# Patient Record
Sex: Female | Born: 1995 | Race: White | Hispanic: No | Marital: Single | Smoking: Never smoker
Health system: Southern US, Community
[De-identification: ages and names within clinical notes are randomized; demographics above are authoritative.]

## PROBLEM LIST (undated history)

## (undated) DIAGNOSIS — F988 Other specified behavioral and emotional disorders with onset usually occurring in childhood and adolescence: Secondary | ICD-10-CM

## (undated) HISTORY — DX: Other specified behavioral and emotional disorders with onset usually occurring in childhood and adolescence: F98.8

## (undated) HISTORY — PX: TONSILLECTOMY: SUR1361

---
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
## 2018-05-11 ENCOUNTER — Encounter (HOSPITAL_BASED_OUTPATIENT_CLINIC_OR_DEPARTMENT_OTHER): Payer: Self-pay | Admitting: Emergency Medicine

## 2018-05-11 ENCOUNTER — Other Ambulatory Visit: Payer: Self-pay

## 2018-05-11 ENCOUNTER — Emergency Department (HOSPITAL_BASED_OUTPATIENT_CLINIC_OR_DEPARTMENT_OTHER)
Admission: EM | Admit: 2018-05-11 | Discharge: 2018-05-12 | Disposition: A | Discharge status: Home / Self Care | Payer: Medicaid Other | Attending: Emergency Medicine | Admitting: Emergency Medicine

## 2018-05-11 DIAGNOSIS — R109 Unspecified abdominal pain: Secondary | ICD-10-CM | POA: Diagnosis not present

## 2018-05-11 LAB — URINALYSIS, MICROSCOPIC (REFLEX)

## 2018-05-11 LAB — PREGNANCY, URINE: Preg Test, Ur: NEGATIVE

## 2018-05-11 LAB — URINALYSIS, ROUTINE W REFLEX MICROSCOPIC
BILIRUBIN URINE: NEGATIVE
Glucose, UA: NEGATIVE mg/dL
Ketones, ur: NEGATIVE mg/dL
Leukocytes, UA: NEGATIVE
NITRITE: NEGATIVE
PH: 6 (ref 5.0–8.0)
PROTEIN: NEGATIVE mg/dL
SPECIFIC GRAVITY, URINE: 1.02 (ref 1.005–1.030)

## 2018-05-11 NOTE — ED Provider Notes (Signed)
[facility] EMERGENCY DEPARTMENT Provider Note   CSN: [id number] Arrival date & time: 05/11/18  1843     History   Chief Complaint Chief Complaint  Patient presents with  . Abdominal Pain    HPI Patricia Middleton is a 22 y.o. female.  Patient is a 22 year old female with past medical history of prior C-section and prior appendectomy.  She presents today with complaints of left lower quadrant and left flank pain.  This started on Christmas and has worsened.  She does report some burning with urination but denies any hematuria.  She denies any fevers or chills.  The history is provided by the patient.  Abdominal Pain   This is a new problem. Episode onset: 5 days ago. The problem occurs constantly. The problem has been gradually worsening. The pain is associated with an unknown factor. The pain is located in the LLQ (Left flank). The quality of the pain is cramping. The pain is moderate. Associated symptoms include dysuria. Pertinent negatives include fever, hematochezia, melena, constipation, frequency and hematuria.    Past Medical History:  Diagnosis Date  . ADD (attention deficit disorder)     Patient Active Problem List   Diagnosis Date Noted  . Sports physical 11/03/2012    Past Surgical History:  Procedure Laterality Date  . TONSILLECTOMY       OB History   No obstetric history on file.      Home Medications    Prior to Admission medications   Medication Sig Start Date End Date Taking? Authorizing Provider  amphetamine-dextroamphetamine (ADDERALL XR) 10 MG 24 hr capsule Take 10 mg by mouth every morning.    [provider]    Family History Family History  Problem Relation Age of Onset  . Sudden death Neg Hx   . Heart attack Neg Hx     Social History Social History   Tobacco Use  . Smoking status: Never Smoker  . Smokeless tobacco: Current User    Types: Snuff  Substance Use Topics  . Alcohol use: Never    Frequency: Never  .  Drug use: Never     Allergies   Patient has no known allergies.   Review of Systems Review of Systems  Constitutional: Negative for fever.  Gastrointestinal: Positive for abdominal pain. Negative for constipation, hematochezia and melena.  Genitourinary: Positive for dysuria. Negative for frequency and hematuria.  All other systems reviewed and are negative.    Physical Exam Updated Vital Signs BP 116/63 (BP Location: Left Arm)   Pulse 70   Temp 99 F (37.2 C) (Oral)   Resp 18   Ht 5' 5.5" (1.664 m)   Wt 99.8 kg   LMP 05/11/2018   SpO2 100%   BMI 36.05 kg/m   Physical Exam Vitals signs and nursing note reviewed.  Constitutional:      General: She is not in acute distress.    Appearance: She is well-developed. She is not diaphoretic.  HENT:     Head: Normocephalic and atraumatic.  Neck:     Musculoskeletal: Normal range of motion and neck supple.  Cardiovascular:     Rate and Rhythm: Normal rate and regular rhythm.     Heart sounds: No murmur. No friction rub. No gallop.   Pulmonary:     Effort: Pulmonary effort is normal. No respiratory distress.     Breath sounds: Normal breath sounds. No wheezing.  Abdominal:     General: Bowel sounds are normal. There is no distension.  Palpations: Abdomen is soft.     Tenderness: There is abdominal tenderness in the left lower quadrant. There is no guarding or rebound.     Comments: There is mild tenderness to palpation in the left lower quadrant.  Musculoskeletal: Normal range of motion.  Skin:    General: Skin is warm and dry.  Neurological:     Mental Status: She is alert and oriented to person, place, and time.      ED Treatments / Results  Labs (all labs ordered are listed, but only abnormal results are displayed) Labs Reviewed  URINALYSIS, ROUTINE W REFLEX MICROSCOPIC - Abnormal; Notable for the following components:      Result Value   Hgb urine dipstick TRACE (*)    All other components within normal  limits  URINALYSIS, MICROSCOPIC (REFLEX) - Abnormal; Notable for the following components:   Bacteria, UA RARE (*)    All other components within normal limits  PREGNANCY, URINE  POC URINE PREG, ED    EKG None  Radiology No results found.  Procedures Procedures (including critical care time)  Medications Ordered in ED Medications - No data to display   Initial Impression / Assessment and Plan / ED Course  I have reviewed the triage vital signs and the nursing notes.  Pertinent labs & imaging results that were available during my care of the patient were reviewed by me and considered in my medical decision making (see chart for details).  Patient presents with left-sided flank and abdominal pain, the etiology of which I am uncertain.  Her CT scan is normal, urinalysis is clear, and pregnancy test is negative.  Patient appears well and I see no indication for further work-up.  She will be discharged, to return as needed for any problems.  Final Clinical Impressions(s) / ED Diagnoses   Final diagnoses:  None    ED Discharge Orders    None       Geoffery Lyonselo, Haward Pope, MD 05/12/18 0221

## 2018-05-11 NOTE — ED Triage Notes (Signed)
Pt c/o 8/10 left lower abd pain radiating to her back and some burning sensation with urination.

## 2018-05-12 ENCOUNTER — Emergency Department (HOSPITAL_BASED_OUTPATIENT_CLINIC_OR_DEPARTMENT_OTHER): Payer: Medicaid Other

## 2018-05-12 NOTE — Discharge Instructions (Addendum)
Ibuprofen 600 mg every 6 hours as needed for pain.  Follow-up with your primary doctor if symptoms or not improving in the next 2 to 3 days, and return to the ER if symptoms significantly worsen or change.

## 2019-01-23 ENCOUNTER — Other Ambulatory Visit: Payer: Self-pay

## 2019-01-23 ENCOUNTER — Encounter (HOSPITAL_BASED_OUTPATIENT_CLINIC_OR_DEPARTMENT_OTHER): Payer: Self-pay | Admitting: *Deleted

## 2019-01-23 ENCOUNTER — Emergency Department (HOSPITAL_BASED_OUTPATIENT_CLINIC_OR_DEPARTMENT_OTHER)
Admission: EM | Admit: 2019-01-23 | Discharge: 2019-01-23 | Disposition: A | Discharge status: Home / Self Care | Payer: Medicaid Other | Attending: Emergency Medicine | Admitting: Emergency Medicine

## 2019-01-23 DIAGNOSIS — Y92019 Unspecified place in single-family (private) house as the place of occurrence of the external cause: Secondary | ICD-10-CM | POA: Diagnosis not present

## 2019-01-23 DIAGNOSIS — T754XXA Electrocution, initial encounter: Secondary | ICD-10-CM | POA: Diagnosis not present

## 2019-01-23 DIAGNOSIS — Y9389 Activity, other specified: Secondary | ICD-10-CM | POA: Insufficient documentation

## 2019-01-23 DIAGNOSIS — Y999 Unspecified external cause status: Secondary | ICD-10-CM | POA: Insufficient documentation

## 2019-01-23 DIAGNOSIS — W868XXA Exposure to other electric current, initial encounter: Secondary | ICD-10-CM | POA: Diagnosis not present

## 2019-01-23 LAB — CK: Total CK: 91 U/L (ref 38–234)
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
## 2019-07-06 IMAGING — CT CT RENAL STONE PROTOCOL
2 of 4 series · 17 of 46 positions shown, 19 images · non-contrast
Comparison: None.

CLINICAL DATA: Left flank pain with dysuria for 1 week. Prior
appendectomy.

EXAM:
CT ABDOMEN AND PELVIS WITHOUT CONTRAST
TECHNIQUE: Multidetector CT imaging of the abdomen and pelvis was performed
following the standard protocol without IV contrast.

[Series 2: axial st · axial · 0.85mm/px · z∈[-462,-17]mm · 14 of 97 slices shown, 16 images]
[im 4/97  soft-tissue]
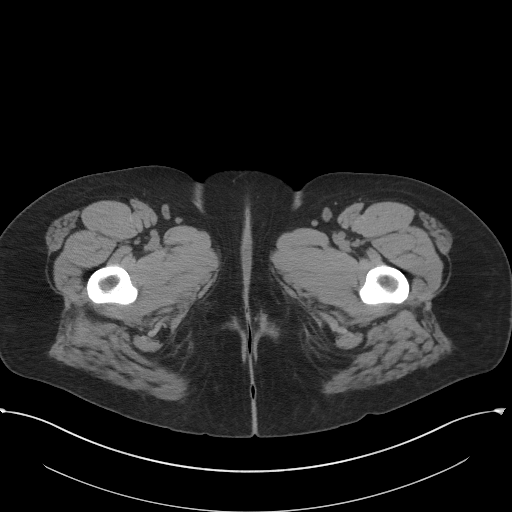
[im 4/97  bone]
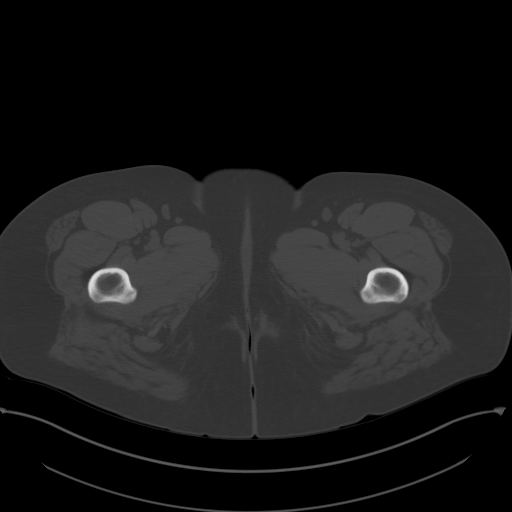
[im 12/97  soft-tissue]
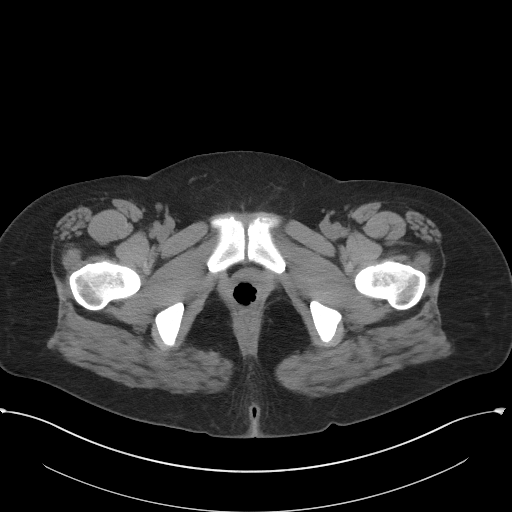
[im 20/97  soft-tissue]
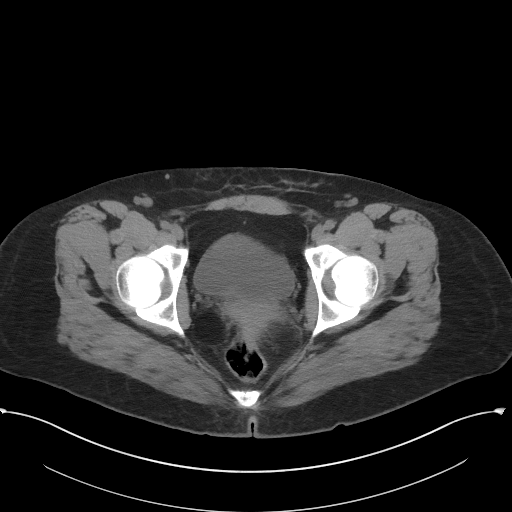
[im 27/97  soft-tissue]
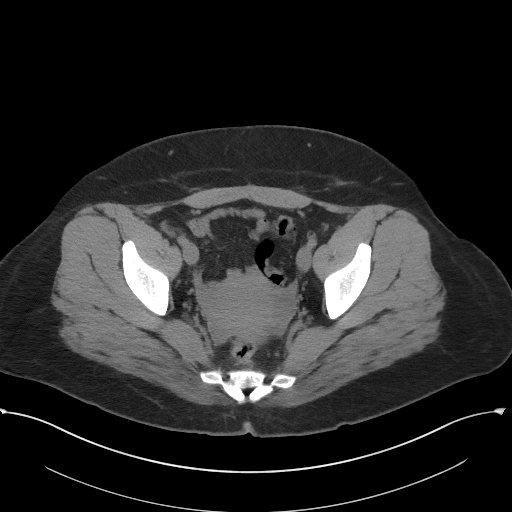
[im 31/97  soft-tissue]
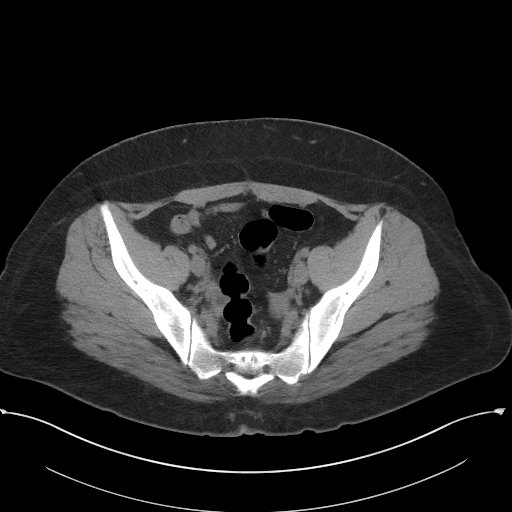
[im 39/97  soft-tissue]
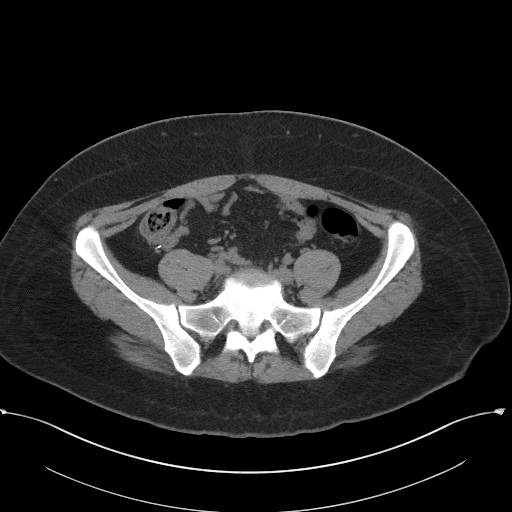
[im 47/97  soft-tissue]
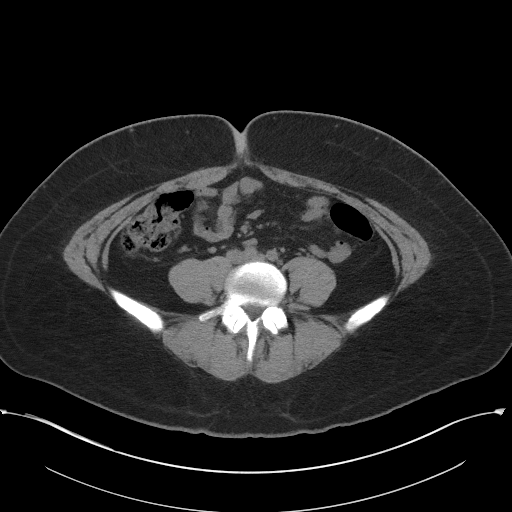
[im 50/97  soft-tissue]
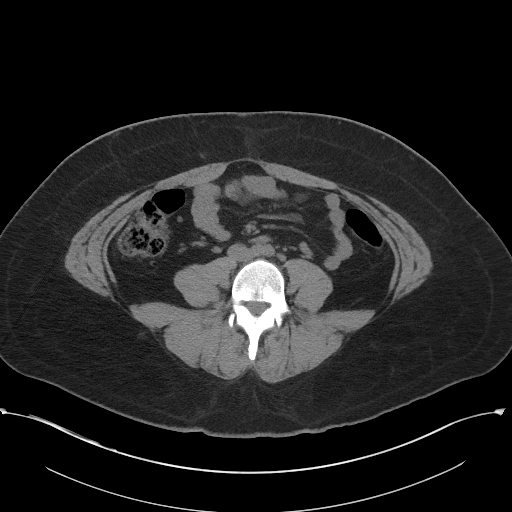
[im 58/97  soft-tissue]
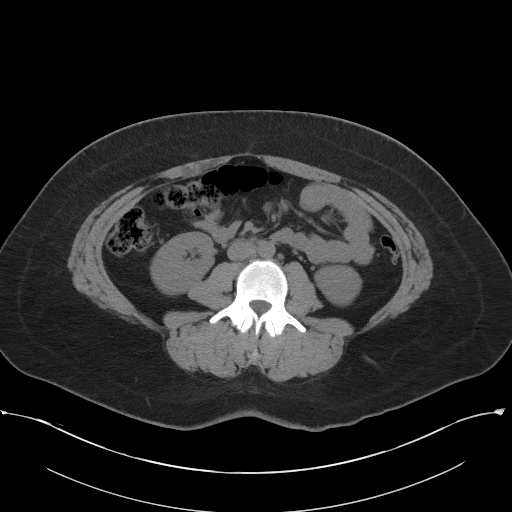
[im 58/97  bone]
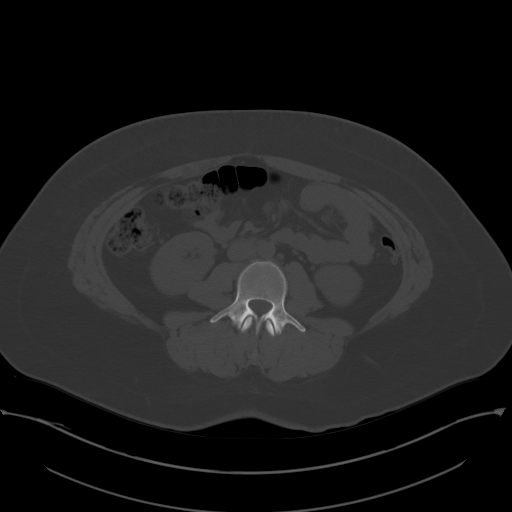
[im 66/97  soft-tissue]
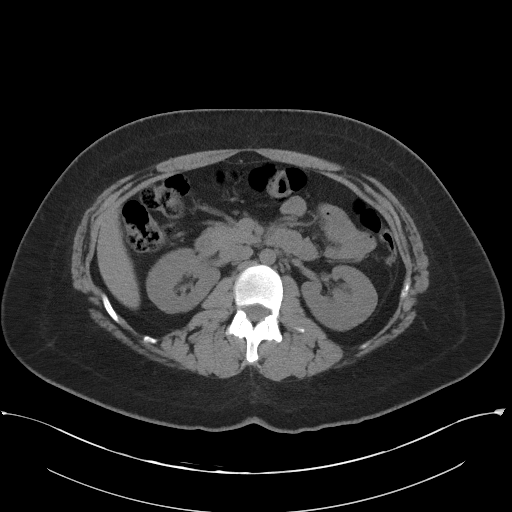
[im 73/97  soft-tissue]
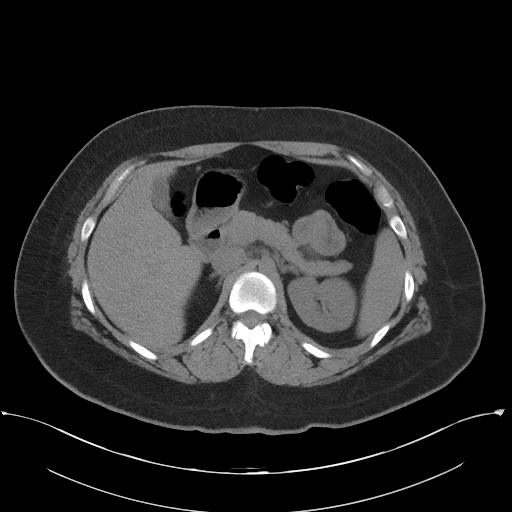
[im 77/97  soft-tissue]
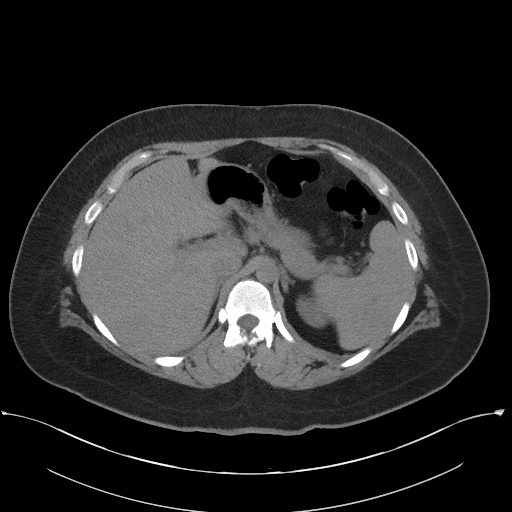
[im 85/97  soft-tissue]
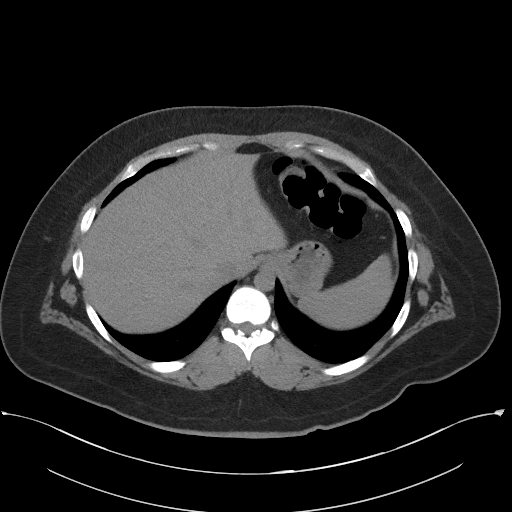
[im 93/97  soft-tissue]
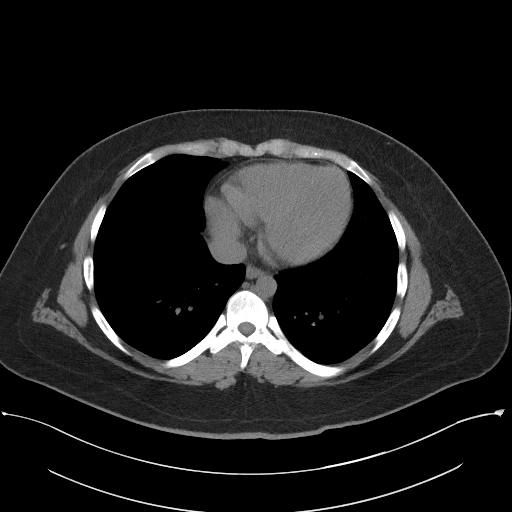

[Series 5: coronal st · coronal · 0.88mm/px · 3 of 87 slices shown]
[im 29/87  soft-tissue]
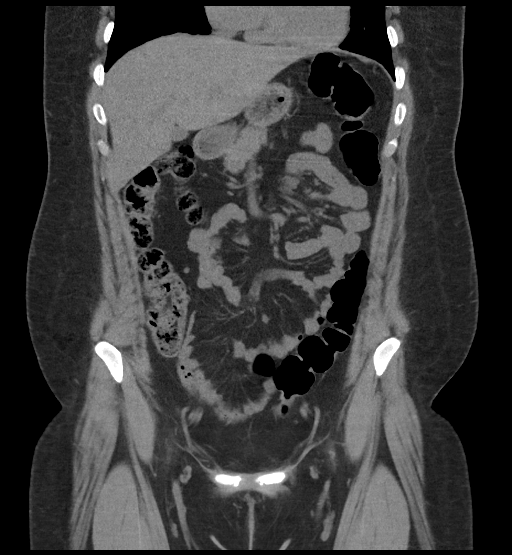
[im 39/87  soft-tissue]
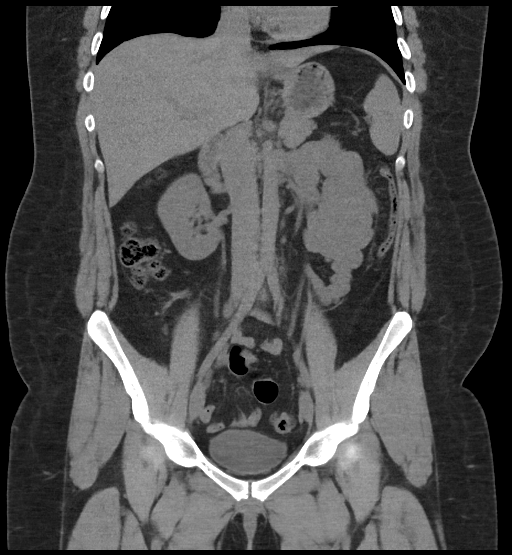
[im 48/87  soft-tissue]
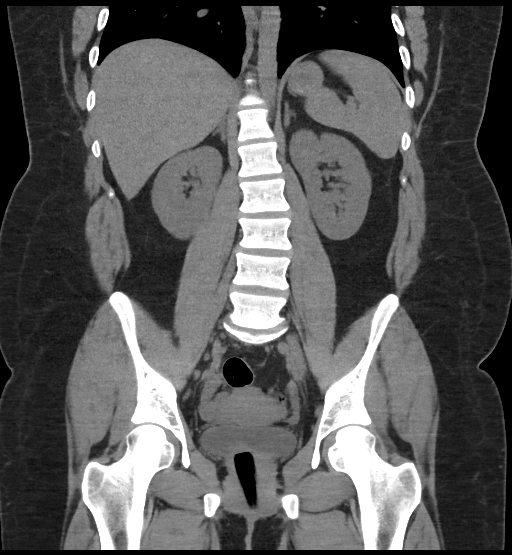

[17 of 46 positions shown; findings below may reference images not displayed]

FINDINGS: Lower chest: No significant pulmonary nodules or acute consolidative
airspace disease.

Hepatobiliary: Normal liver size. No liver mass. Normal gallbladder
with no radiopaque cholelithiasis. No biliary ductal dilatation.

Pancreas: Normal, with no mass or duct dilation.

Spleen: Normal size. No mass.

Adrenals/Urinary Tract: Normal adrenals. No renal stones. No
hydronephrosis. No contour deforming renal masses. Normal bladder.

Stomach/Bowel: Normal non-distended stomach. Normal caliber small
bowel with no small bowel wall thickening. Appendectomy. Normal
large bowel with no diverticulosis, large bowel wall thickening or
pericolonic fat stranding.

Vascular/Lymphatic: Normal caliber abdominal aorta. No
pathologically enlarged lymph nodes in the abdomen or pelvis.

Reproductive: Grossly normal uterus.  No adnexal mass.

Other: No pneumoperitoneum, ascites or focal fluid collection.

Musculoskeletal: No aggressive appearing focal osseous lesions.
IMPRESSION: No acute abnormality. No urolithiasis. No hydronephrosis. No
evidence of bowel obstruction or acute bowel inflammation.
